# Patient Record
Sex: Female | Born: 1990
Health system: Southern US, Community
[De-identification: ages and names within clinical notes are randomized; demographics above are authoritative.]

## PROBLEM LIST (undated history)

## (undated) DIAGNOSIS — L989 Disorder of the skin and subcutaneous tissue, unspecified: Secondary | ICD-10-CM

## (undated) HISTORY — PX: NO PAST SURGERIES: SHX2092

---

## 2011-07-15 ENCOUNTER — Emergency Department (HOSPITAL_COMMUNITY)
Admission: EM | Admit: 2011-07-15 | Discharge: 2011-07-16 | Disposition: A | Discharge status: Home / Self Care | Attending: Emergency Medicine | Admitting: Emergency Medicine

## 2011-07-15 DIAGNOSIS — R221 Localized swelling, mass and lump, neck: Secondary | ICD-10-CM | POA: Insufficient documentation

## 2011-07-15 DIAGNOSIS — I10 Essential (primary) hypertension: Secondary | ICD-10-CM | POA: Insufficient documentation

## 2011-07-15 DIAGNOSIS — T7840XA Allergy, unspecified, initial encounter: Secondary | ICD-10-CM | POA: Insufficient documentation

## 2011-07-15 DIAGNOSIS — L2989 Other pruritus: Secondary | ICD-10-CM | POA: Insufficient documentation

## 2011-07-15 DIAGNOSIS — R220 Localized swelling, mass and lump, head: Secondary | ICD-10-CM | POA: Insufficient documentation

## 2011-07-15 DIAGNOSIS — R21 Rash and other nonspecific skin eruption: Secondary | ICD-10-CM | POA: Insufficient documentation

## 2011-07-15 DIAGNOSIS — L298 Other pruritus: Secondary | ICD-10-CM | POA: Insufficient documentation

## 2011-07-16 ENCOUNTER — Emergency Department (HOSPITAL_COMMUNITY)

## 2012-01-09 ENCOUNTER — Encounter (HOSPITAL_COMMUNITY): Payer: Self-pay | Admitting: Emergency Medicine

## 2012-01-09 ENCOUNTER — Emergency Department (INDEPENDENT_AMBULATORY_CARE_PROVIDER_SITE_OTHER)
Admission: EM | Admit: 2012-01-09 | Discharge: 2012-01-09 | Disposition: A | Discharge status: Home / Self Care | Source: Home / Self Care | Attending: Emergency Medicine | Admitting: Emergency Medicine

## 2012-01-09 DIAGNOSIS — L739 Follicular disorder, unspecified: Secondary | ICD-10-CM

## 2012-01-09 DIAGNOSIS — L738 Other specified follicular disorders: Secondary | ICD-10-CM

## 2012-01-09 HISTORY — DX: Disorder of the skin and subcutaneous tissue, unspecified: L98.9

## 2012-01-09 MED ORDER — MUPIROCIN 2 % EX OINT: TOPICAL_OINTMENT | Freq: Three times a day (TID) | CUTANEOUS | Status: AC

## 2012-01-09 MED ORDER — MUPIROCIN 2 % EX OINT: TOPICAL_OINTMENT | Freq: Three times a day (TID) | CUTANEOUS | Status: DC

## 2012-01-09 NOTE — Discharge Instructions (Signed)
Folliculitis  °Folliculitis is an infection and inflammation of the hair follicles. Hair follicles become red and irritated. This inflammation is usually caused by bacteria. The bacteria thrive in warm, moist environments. This condition can be seen anywhere on the body.  °CAUSES °The most common cause of folliculitis is an infection by germs (bacteria). Fungal and viral infections can also cause the condition. Viral infections may be more common in people whose bodies are unable to fight disease well (weakened immune systems). Examples include people with: °· AIDS.  °· An organ transplant.  °· Cancer.  °People with depressed immune systems, diabetes, or obesity, have a greater risk of getting folliculitis than the general population. Certain chemicals, especially oils and tars, also can cause folliculitis. °SYMPTOMS °· An early sign of folliculitis is a small, white or yellow pus-filled, itchy lesion (pustule). These lesions appear on a red, inflamed follicle. They are usually less than 5 mm (.20 inches).  °· The most likely starting points are the scalp, thighs, legs, back and buttocks. Folliculitis is also frequently found in areas of repeated shaving.  °· When an infection of the follicle goes deeper, it becomes a boil or furuncle. A group of closely packed boils create a larger lesion (a carbuncle). These sores (lesions) tend to occur in hairy, sweaty areas of the body.  °TREATMENT  °· A doctor who specializes in skin problems (dermatologists) treats mild cases of folliculitis with antiseptic washes.  °· They also use a skin application which kills germs (topical antibiotics). Tea tree oil is a good topical antiseptic as well. It can be found at a health food store. A small percentage of individuals may develop an allergy to the tea tree oil.  °· Mild to moderate boils respond well to warm water compresses applied three times daily.  °· In some cases, oral antibiotics should be taken with the skin treatment.    °· If lesions contain large quantities of pus or fluid, your caregiver may drain them. This allows the topical antibiotics to get to the affected areas better.  °· Stubborn cases of folliculitis may respond to laser hair removal. This process uses a high intensity light beam (a laser) to destroy the follicle and reduces the scarring from folliculitis. After laser hair removal, hair will no longer grow in the laser treated area.  °Patients with long-lasting folliculitis need to find out where the infection is coming from. Germs can live in the nostrils of the patient. This can trigger an outbreak now and then. Sometimes the bacteria live in the nostrils of a family member. This person does not develop the disorder but they repeatedly re-expose others to the germ. To break the cycle of recurrence in the patient, the family member must also undergo treatment. °PREVENTION  °· Individuals who are predisposed to folliculitis should be extremely careful about personal hygiene.  °· Application of antiseptic washes may help prevent recurrences.  °· A topical antibiotic cream, mupirocin (Bactroban®), has been effective at reducing bacteria in the nostrils. It is applied inside the nose with your little finger. This is done twice daily for a week. Then it is repeated every 6 months.  °· Because follicle disorders tend to come back, patients must receive follow-up care. Your caregiver may be able to recognize a recurrence before it becomes severe.  °SEEK IMMEDIATE MEDICAL CARE IF:  °· You develop redness, swelling, or increasing pain in the area.  °· You have a fever.  °· You are not improving with treatment   or are getting worse.  °· You have any other questions or concerns.  °Document Released: 11/11/2001 Document Revised: 08/22/2011 Document Reviewed: 09/07/2008 °[employer]® Patient Information ©2012 [employer]. °

## 2012-01-09 NOTE — ED Notes (Signed)
Patient reports pumps noticed in pubic area, reports as an area that she shaves .  Denies vaginal discharge.

## 2012-01-09 NOTE — ED Provider Notes (Signed)
History     CSN: [id number]  Arrival date & time 01/09/12  1237   First MD Initiated Contact with Patient 01/09/12 1246      Chief Complaint  Patient presents with  . Rash    (Consider location/radiation/quality/duration/timing/severity/associated sxs/prior treatment) HPI Comments: Patient describes that after she shaved her pubic area she have noticed as little bumps all over her pubic area and minimally to her external genitalia. Not particularly tender red looked like little cystic structures "like little bumps". Patient denies any further symptoms in his groin tenderness rashes or sores, and no vaginal discharge.  Patient is a 21 y.o. female presenting with rash. The history is provided by the patient.  Rash  This is a new problem. The current episode started more than 2 days ago. The problem has not changed since onset.   Past Medical History  Diagnosis Date  . Skin abnormalities     skin condition , unknown diagnosis    History reviewed. No pertinent past surgical history.  History reviewed. No pertinent family history.  History  Substance Use Topics  . Smoking status: Current Everyday Smoker  . Smokeless tobacco: Not on file  . Alcohol Use: Yes    OB History    Grav Para Term Preterm Abortions TAB SAB Ect Mult Living                  Review of Systems  Constitutional: Negative for fever and chills.  Genitourinary: Negative for dysuria, vaginal bleeding, vaginal discharge and vaginal pain.  Skin: Positive for rash. Negative for wound.    Allergies  Review of patient's allergies indicates no known allergies.  Home Medications   Current Outpatient Rx  Name Route Sig Dispense Refill  . DOXYCYCLINE HYCLATE 100 MG PO CPEP Oral Take 100 mg by mouth 2 (two) times daily.    Marland Kitchen MUPIROCIN 2 % EX OINT Topical Apply topically 3 (three) times daily. X 7 DAYS 22 g 0    BP 125/79  Pulse 63  Temp(Src) 98.5 F (36.9 C) (Oral)  Resp 16  SpO2 100%  LMP  12/26/2011  Physical Exam  Nursing note and vitals reviewed. Constitutional: She appears well-developed and well-nourished.  Genitourinary:    There is no rash or tenderness on the right labia. There is no rash or tenderness on the left labia.  Skin: Rash noted.    ED Course  Procedures (including critical care time)  Labs Reviewed - No data to display No results found.   1. Folliculitis       MDM  Pubic folliculitis        Jimmie Molly, MD 01/09/12 1322

## 2012-01-09 NOTE — ED Notes (Signed)
chaporoned physician for examination of rash.

## 2012-01-13 LAB — CULTURE, ROUTINE-ABSCESS: Gram Stain: NONE SEEN

## 2012-06-08 ENCOUNTER — Emergency Department (INDEPENDENT_AMBULATORY_CARE_PROVIDER_SITE_OTHER)
Admission: EM | Admit: 2012-06-08 | Discharge: 2012-06-08 | Disposition: A | Discharge status: Home / Self Care | Payer: Self-pay | Source: Home / Self Care | Attending: Emergency Medicine | Admitting: Emergency Medicine

## 2012-06-08 ENCOUNTER — Encounter (HOSPITAL_COMMUNITY): Payer: Self-pay

## 2012-06-08 DIAGNOSIS — S161XXA Strain of muscle, fascia and tendon at neck level, initial encounter: Secondary | ICD-10-CM

## 2012-06-08 DIAGNOSIS — S0003XA Contusion of scalp, initial encounter: Secondary | ICD-10-CM

## 2012-06-08 DIAGNOSIS — S1093XA Contusion of unspecified part of neck, initial encounter: Secondary | ICD-10-CM

## 2012-06-08 DIAGNOSIS — S0083XA Contusion of other part of head, initial encounter: Secondary | ICD-10-CM

## 2012-06-08 DIAGNOSIS — IMO0002 Reserved for concepts with insufficient information to code with codable children: Secondary | ICD-10-CM

## 2012-06-08 MED ORDER — MELOXICAM 15 MG PO TABS: 15.0000 mg | ORAL_TABLET | Freq: Every day | ORAL | Status: DC

## 2012-06-08 MED ORDER — METHOCARBAMOL 500 MG PO TABS: 500.0000 mg | ORAL_TABLET | Freq: Three times a day (TID) | ORAL | Status: DC

## 2012-06-08 MED ORDER — TRAMADOL HCL 50 MG PO TABS: 100.0000 mg | ORAL_TABLET | Freq: Three times a day (TID) | ORAL | Status: DC | PRN
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
## 2012-06-09 NOTE — ED Notes (Signed)
Pt called earlier wanting a note for work stating that she needs to be on "light duty".... Spoke w/Dr. Artis Flock and he said it was fine for today and tomorrow... Called pt at [phone] and notified her... Note is at the front ready for pick up.

## 2012-06-10 ENCOUNTER — Encounter (HOSPITAL_COMMUNITY): Payer: Self-pay

## 2012-06-10 ENCOUNTER — Emergency Department (INDEPENDENT_AMBULATORY_CARE_PROVIDER_SITE_OTHER)
Admission: EM | Admit: 2012-06-10 | Discharge: 2012-06-10 | Disposition: A | Discharge status: Home / Self Care | Payer: Self-pay | Source: Home / Self Care | Attending: Emergency Medicine | Admitting: Emergency Medicine

## 2012-06-10 DIAGNOSIS — M62838 Other muscle spasm: Secondary | ICD-10-CM

## 2012-06-10 DIAGNOSIS — G44209 Tension-type headache, unspecified, not intractable: Secondary | ICD-10-CM

## 2012-06-10 MED ORDER — ACETAMINOPHEN 325 MG PO TABS
650.0000 mg | ORAL_TABLET | Freq: Once | ORAL | Status: AC
  Administered 2012-06-10: 650 mg via ORAL

## 2012-06-10 MED ORDER — METHYLPREDNISOLONE 4 MG PO KIT: PACK | ORAL | Status: DC

## 2012-06-10 MED ORDER — ACETAMINOPHEN 325 MG PO TABS
ORAL_TABLET | ORAL | Status: AC
  Filled 2012-06-10: qty 2
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
## 2012-06-11 NOTE — ED Provider Notes (Signed)
Medical screening examination/treatment/procedure(s) were performed by non-physician practitioner and as supervising physician I was immediately available for consultation/collaboration.  Leslee Home, M.D.   Reuben Likes, MD 06/11/12 [phone]

## 2012-08-25 ENCOUNTER — Ambulatory Visit
Admission: RE | Admit: 2012-08-25 | Discharge: 2012-08-25 | Disposition: A | Discharge status: Home / Self Care | Payer: No Typology Code available for payment source | Source: Ambulatory Visit | Attending: Infectious Diseases | Admitting: Infectious Diseases

## 2012-08-25 ENCOUNTER — Other Ambulatory Visit: Payer: Self-pay | Admitting: Infectious Diseases

## 2012-08-25 DIAGNOSIS — R7611 Nonspecific reaction to tuberculin skin test without active tuberculosis: Secondary | ICD-10-CM

## 2012-09-22 ENCOUNTER — Emergency Department (HOSPITAL_BASED_OUTPATIENT_CLINIC_OR_DEPARTMENT_OTHER)
Admission: EM | Admit: 2012-09-22 | Discharge: 2012-09-22 | Disposition: A | Discharge status: Home / Self Care | Payer: Self-pay | Attending: Emergency Medicine | Admitting: Emergency Medicine

## 2012-09-22 ENCOUNTER — Encounter (HOSPITAL_BASED_OUTPATIENT_CLINIC_OR_DEPARTMENT_OTHER): Payer: Self-pay | Admitting: *Deleted

## 2012-09-22 DIAGNOSIS — F17200 Nicotine dependence, unspecified, uncomplicated: Secondary | ICD-10-CM | POA: Insufficient documentation

## 2012-09-22 DIAGNOSIS — L02219 Cutaneous abscess of trunk, unspecified: Secondary | ICD-10-CM | POA: Insufficient documentation

## 2012-09-22 DIAGNOSIS — L0291 Cutaneous abscess, unspecified: Secondary | ICD-10-CM

## 2012-09-22 DIAGNOSIS — L519 Erythema multiforme, unspecified: Secondary | ICD-10-CM | POA: Insufficient documentation

## 2012-09-22 DIAGNOSIS — Z872 Personal history of diseases of the skin and subcutaneous tissue: Secondary | ICD-10-CM | POA: Insufficient documentation

## 2012-09-22 DIAGNOSIS — Z79899 Other long term (current) drug therapy: Secondary | ICD-10-CM | POA: Insufficient documentation

## 2012-09-22 MED ORDER — SULFAMETHOXAZOLE-TRIMETHOPRIM 800-160 MG PO TABS: 1.0000 | ORAL_TABLET | Freq: Two times a day (BID) | ORAL | Status: DC

## 2012-09-22 MED ORDER — HYDROCODONE-ACETAMINOPHEN 5-325 MG PO TABS
2.0000 | ORAL_TABLET | ORAL | Status: DC | PRN
Start: 2012-09-22 — End: 2013-11-07

## 2012-09-22 MED ORDER — LIDOCAINE HCL 2 % IJ SOLN
INTRAMUSCULAR | Status: AC
  Administered 2012-09-22: 22:00:00 via SUBCUTANEOUS
  Filled 2012-09-22: qty 20

## 2012-09-22 NOTE — ED Provider Notes (Signed)
Medical screening examination/treatment/procedure(s) were performed by non-physician practitioner and as supervising physician I was immediately available for consultation/collaboration.  Ethelda Chick, MD 09/22/12 [phone]

## 2012-09-22 NOTE — ED Notes (Signed)
PA at bedside.

## 2012-09-22 NOTE — ED Provider Notes (Signed)
History     CSN: [id number]  Arrival date & time 09/22/12  2016   First MD Initiated Contact with Patient 09/22/12 2211      Chief Complaint  Patient presents with  . Abscess    (Consider location/radiation/quality/duration/timing/severity/associated sxs/prior treatment) Patient is a 22 y.o. female presenting with abscess. The history is provided by the patient. No language interpreter was used.  Abscess  This is a new problem. Episode onset: 3 days. The onset was gradual. The problem occurs continuously. The problem has been gradually worsening. The abscess is present on the groin. The problem is moderate. The abscess is characterized by redness. The abscess first occurred at home. There were no sick contacts. She has received no recent medical care.  Pt has a history of hidradenitis  Past Medical History  Diagnosis Date  . Skin abnormalities     skin condition , unknown diagnosis    History reviewed. No pertinent past surgical history.  No family history on file.  History  Substance Use Topics  . Smoking status: Current Every Day Smoker  . Smokeless tobacco: Not on file  . Alcohol Use: Yes    OB History    Grav Para Term Preterm Abortions TAB SAB Ect Mult Living                  Review of Systems  All other systems reviewed and are negative.    Allergies  Review of patient's allergies indicates no known allergies.  Home Medications   Current Outpatient Rx  Name  Route  Sig  Dispense  Refill  . DOXYCYCLINE HYCLATE 100 MG PO CPEP   Oral   Take 100 mg by mouth 2 (two) times daily.         Marland Kitchen HYDROCODONE-ACETAMINOPHEN 5-325 MG PO TABS   Oral   Take 2 tablets by mouth every 4 (four) hours as needed for pain.   10 tablet   0   . MELOXICAM 15 MG PO TABS   Oral   Take 1 tablet (15 mg total) by mouth daily.   15 tablet   0   . METHOCARBAMOL 500 MG PO TABS   Oral   Take 1 tablet (500 mg total) by mouth 3 (three) times daily.   30 tablet   0   .  METHYLPREDNISOLONE 4 MG PO KIT      follow package directions   21 tablet   0   . SULFAMETHOXAZOLE-TRIMETHOPRIM 800-160 MG PO TABS   Oral   Take 1 tablet by mouth every 12 (twelve) hours.   10 tablet   0   . TRAMADOL HCL 50 MG PO TABS   Oral   Take 2 tablets (100 mg total) by mouth every 8 (eight) hours as needed for pain.   30 tablet   0     BP 140/89  Pulse 74  Temp 98.6 F (37 C) (Oral)  Resp 20  SpO2 100%  Physical Exam  Nursing note and vitals reviewed. Constitutional: She appears well-developed and well-nourished.  Cardiovascular: Normal rate.   Pulmonary/Chest: Effort normal.  Musculoskeletal: Normal range of motion.       2cm swollen red area left inner thigh   Neurological: She is alert.  Skin: Skin is warm. There is erythema.    ED Course  INCISION AND DRAINAGE Date/Time: 09/22/2012 10:42 PM Performed by: Elson Areas Authorized by: Elson Areas Consent: Verbal consent not obtained. Risks and benefits: risks, benefits and alternatives  were discussed Required items: required blood products, implants, devices, and special equipment available Time out: Immediately prior to procedure a "time out" was called to verify the correct patient, procedure, equipment, support staff and site/side marked as required. Type: abscess Body area: lower extremity Anesthesia: local infiltration Patient sedated: no Scalpel size: 11 Incision type: single straight Complexity: simple Drainage amount: copious Patient tolerance: Patient tolerated the procedure well with no immediate complications.   (including critical care time)  Labs Reviewed - No data to display No results found.   1. Abscess       MDM  Bactrim and hydrocodone        Lonia Skinner [place], [place] 09/22/12 2243

## 2012-09-22 NOTE — ED Notes (Signed)
I & D tray is set up and at the bedside. The patient is undressed from the waist down.

## 2012-09-22 NOTE — ED Notes (Signed)
Abscess on her buttocks x 3 days.  

## 2013-11-07 ENCOUNTER — Inpatient Hospital Stay (HOSPITAL_COMMUNITY)
Admission: AD | Admit: 2013-11-07 | Discharge: 2013-11-07 | Disposition: A | Discharge status: Home / Self Care | Payer: No Typology Code available for payment source | Source: Ambulatory Visit | Attending: Obstetrics & Gynecology | Admitting: Obstetrics & Gynecology

## 2013-11-07 ENCOUNTER — Encounter (HOSPITAL_COMMUNITY): Payer: Self-pay | Admitting: *Deleted

## 2013-11-07 DIAGNOSIS — L02213 Cutaneous abscess of chest wall: Secondary | ICD-10-CM

## 2013-11-07 DIAGNOSIS — Z87891 Personal history of nicotine dependence: Secondary | ICD-10-CM | POA: Insufficient documentation

## 2013-11-07 DIAGNOSIS — L03319 Cellulitis of trunk, unspecified: Principal | ICD-10-CM

## 2013-11-07 DIAGNOSIS — L02219 Cutaneous abscess of trunk, unspecified: Secondary | ICD-10-CM | POA: Insufficient documentation

## 2013-11-07 LAB — CBC
HCT: 37 % (ref 36.0–46.0)
Hemoglobin: 12.8 g/dL (ref 12.0–15.0)
MCH: 27.3 pg (ref 26.0–34.0)
MCHC: 34.6 g/dL (ref 30.0–36.0)
MCV: 78.9 fL (ref 78.0–100.0)
PLATELETS: 221 10*3/uL (ref 150–400)
RBC: 4.69 MIL/uL (ref 3.87–5.11)
RDW: 14.5 % (ref 11.5–15.5)
WBC: 5.3 10*3/uL (ref 4.0–10.5)

## 2013-11-07 LAB — POCT PREGNANCY, URINE: PREG TEST UR: NEGATIVE

## 2013-11-07 MED ORDER — IBUPROFEN 600 MG PO TABS: 600.0000 mg | ORAL_TABLET | Freq: Four times a day (QID) | ORAL | Status: DC | PRN

## 2013-11-07 MED ORDER — DOXYCYCLINE HYCLATE 100 MG PO CAPS: 100.0000 mg | ORAL_CAPSULE | Freq: Two times a day (BID) | ORAL | Status: DC

## 2013-11-07 NOTE — MAU Provider Note (Signed)
History     CSN: [id number]  Arrival date and time: 11/07/13 [id number]   First Provider Initiated Contact with Patient 11/07/13 1032      Chief Complaint  Patient presents with  . Breast Mass   HPI   Wendy Cook is a 23 y.o. female G1P0010 who presents with a right painful breast mass. She first noticed it two weeks ago; she watched it for two weeks and feels it has grown in size and the pain severity has increased. The patient normally sleeps on her stomach and since the mass has been there she is unable to get a good nights rest due to the pain.   OB History   Grav Para Term Preterm Abortions TAB SAB Ect Mult Living   1 0   1  1   0      Past Medical History  Diagnosis Date  . Skin abnormalities     skin condition , unknown diagnosis    Past Surgical History  Procedure Laterality Date  . No past surgeries      Family History  Problem Relation Age of Onset  . Cancer Mother   . Diabetes Mother   . Hypertension Mother   . Heart disease Father   . Cancer Maternal Aunt   . Cancer Maternal Grandmother     History  Substance Use Topics  . Smoking status: Former Games developermoker  . Smokeless tobacco: Never Used  . Alcohol Use: Yes    Allergies: No Known Allergies  Prescriptions prior to admission  Medication Sig Dispense Refill  . acetaminophen (TYLENOL) 500 MG tablet Take 1,000 mg by mouth every 6 (six) hours as needed.      . Multiple Vitamin (MULTIVITAMIN WITH MINERALS) TABS tablet Take 1 tablet by mouth daily.       Results for orders placed during the hospital encounter of 11/07/13 (from the past 48 hour(s))  CBC     Status: None   Collection Time    11/07/13 10:55 AM      Result Value Ref Range   WBC 5.3  4.0 - 10.5 K/uL   RBC 4.69  3.87 - 5.11 MIL/uL   Hemoglobin 12.8  12.0 - 15.0 g/dL   HCT 69.637.0  29.536.0 - 28.446.0 %   MCV 78.9  78.0 - 100.0 fL   MCH 27.3  26.0 - 34.0 pg   MCHC 34.6  30.0 - 36.0 g/dL   RDW 13.214.5  44.011.5 - 10.215.5 %   Platelets 221  150 - 400  K/uL  POCT PREGNANCY, URINE     Status: None   Collection Time    11/07/13 11:32 AM      Result Value Ref Range   Preg Test, Ur NEGATIVE  NEGATIVE   Comment:            THE SENSITIVITY OF THIS     METHODOLOGY IS >24 mIU/mL    Review of Systems  Constitutional: Negative for fever and chills.  Genitourinary: Negative for dysuria, urgency, frequency, hematuria and flank pain.  Skin: Negative for itching and rash.       Right painful breast mass    Physical Exam   Blood pressure 136/77, pulse 80, temperature 97.8 F (36.6 C), temperature source Oral, resp. rate 20, height 5\' 3"  (1.6 m), weight 106.595 kg (235 lb), last menstrual period 11/06/2013.  Physical Exam  Constitutional: She is oriented to person, place, and time. She appears well-developed and well-nourished.  Non-toxic appearance. She does  not have a sickly appearance. She does not appear ill. No distress.  HENT:  Head: Normocephalic.  Eyes: Pupils are equal, round, and reactive to light.  Neck: Neck supple.  Respiratory: She exhibits mass, tenderness and swelling. Right breast exhibits no skin change. Left breast exhibits no skin change.    3cm X 1 cm deep tissue abscess on chest wall; warm and tender to touch. Erythema noted  Musculoskeletal: Normal range of motion.  Neurological: She is alert and oriented to person, place, and time.  Skin: Skin is warm. She is not diaphoretic.  Psychiatric: Her behavior is normal.    MAU Course  Procedures None  MDM CBC Urine pregnancy test Dr. Penne Lash consulted for evaluation and management of chest wall abscess.   Assessment and Plan   A:  1. Abscess of chest wall   2.  Cellulitis   P:  Discharge home in stable condition RX: Doxycycline        Ibuprofen 600 mg Warm compresses QID  General surgery consulted; they would like to see the patient as soon as possible and will call the patient tomorrow with an appointment time. Pt was instructed to call them if she has  not heard from them tomorrow; phone number provided   Wendy Cook Wendy Moeckel, NP  11/07/2013, 3:42 PM

## 2013-11-07 NOTE — MAU Provider Note (Signed)
Pt seen and examined. Attestation of Attending Supervision of Advanced Practitioner (CNM/NP): Evaluation and management procedures were performed by the Advanced Practitioner under my supervision and collaboration. I have reviewed the Advanced Practitioner's note and chart, and I agree with the management and plan.  Evely Gainey H. 4:30 PM   Shomari Scicchitano H.

## 2013-11-07 NOTE — Progress Notes (Signed)
Pt seen and examined.   Exam consistent with deep subcutaneous cellulitis.  No fluctuant area on exam.  Spoke with general surgery Dr. Gerrit FriendsGerkin.  He suggest oupatient US and follow up in their office tomorrow.  They will call with appt.  We will give doxycycline RX, ibuprofen, and warm compresses.

## 2013-11-07 NOTE — Discharge Instructions (Signed)
Abscess °An abscess is an infected area that contains a collection of pus and debris. It can occur in almost any part of the body. An abscess is also known as a furuncle or boil. °CAUSES  °An abscess occurs when tissue gets infected. This can occur from blockage of oil or sweat glands, infection of hair follicles, or a minor injury to the skin. As the body tries to fight the infection, pus collects in the area and creates pressure under the skin. This pressure causes pain. People with weakened immune systems have difficulty fighting infections and get certain abscesses more often.  °SYMPTOMS °Usually an abscess develops on the skin and becomes a painful mass that is red, warm, and tender. If the abscess forms under the skin, you may feel a moveable soft area under the skin. Some abscesses break open (rupture) on their own, but most will continue to get worse without care. The infection can spread deeper into the body and eventually into the bloodstream, causing you to feel ill.  °DIAGNOSIS  °Your caregiver will take your medical history and perform a physical exam. A sample of fluid may also be taken from the abscess to determine what is causing your infection. °TREATMENT  °Your caregiver may prescribe antibiotic medicines to fight the infection. However, taking antibiotics alone usually does not cure an abscess. Your caregiver may need to make a small cut (incision) in the abscess to drain the pus. In some cases, gauze is packed into the abscess to reduce pain and to continue draining the area. °HOME CARE INSTRUCTIONS  °· Only take over-the-counter or prescription medicines for pain, discomfort, or fever as directed by your caregiver. °· If you were prescribed antibiotics, take them as directed. Finish them even if you start to feel better. °· If gauze is used, follow your caregiver's directions for changing the gauze. °· To avoid spreading the infection: °· Keep your draining abscess covered with a  bandage. °· Wash your hands well. °· Do not share personal care items, towels, or whirlpools with others. °· Avoid skin contact with others. °· Keep your skin and clothes clean around the abscess. °· Keep all follow-up appointments as directed by your caregiver. °SEEK MEDICAL CARE IF:  °· You have increased pain, swelling, redness, fluid drainage, or bleeding. °· You have muscle aches, chills, or a general ill feeling. °· You have a fever. °MAKE SURE YOU:  °· Understand these instructions. °· Will watch your condition. °· Will get help right away if you are not doing well or get worse. °Document Released: 06/12/2005 Document Revised: 03/03/2012 Document Reviewed: 11/15/2011 °ExitCare® Patient Information ©2014 [employer]. ° °Cellulitis °Cellulitis is an infection of the skin and the tissue beneath it. The infected area is usually red and tender. Cellulitis occurs most often in the arms and lower legs.  °CAUSES  °Cellulitis is caused by bacteria that enter the skin through cracks or cuts in the skin. The most common types of bacteria that cause cellulitis are Staphylococcus and Streptococcus. °SYMPTOMS  °· Redness and warmth. °· Swelling. °· Tenderness or pain. °· Fever. °DIAGNOSIS  °Your caregiver can usually determine what is wrong based on a physical exam. Blood tests may also be done. °TREATMENT  °Treatment usually involves taking an antibiotic medicine. °HOME CARE INSTRUCTIONS  °· Take your antibiotics as directed. Finish them even if you start to feel better. °· Keep the infected arm or leg elevated to reduce swelling. °· Apply a warm cloth to the affected area up to   4 times per day to relieve pain. °· Only take over-the-counter or prescription medicines for pain, discomfort, or fever as directed by your caregiver. °· Keep all follow-up appointments as directed by your caregiver. °SEEK MEDICAL CARE IF:  °· You notice red streaks coming from the infected area. °· Your red area gets larger or turns dark in  color. °· Your bone or joint underneath the infected area becomes painful after the skin has healed. °· Your infection returns in the same area or another area. °· You notice a swollen bump in the infected area. °· You develop new symptoms. °SEEK IMMEDIATE MEDICAL CARE IF:  °· You have a fever. °· You feel very sleepy. °· You develop vomiting or diarrhea. °· You have a general ill feeling (malaise) with muscle aches and pains. °MAKE SURE YOU:  °· Understand these instructions. °· Will watch your condition. °· Will get help right away if you are not doing well or get worse. °Document Released: 06/12/2005 Document Revised: 03/03/2012 Document Reviewed: 11/18/2011 °ExitCare® Patient Information ©2014 [employer]. ° °

## 2013-11-07 NOTE — MAU Note (Signed)
Patient presents with complaint of a painful lump in her right breast. Patient states she noticed the lump 2 weeks ago. Over the last 3 days the lump has enlarged and become painful.

## 2013-11-08 ENCOUNTER — Encounter (HOSPITAL_BASED_OUTPATIENT_CLINIC_OR_DEPARTMENT_OTHER): Payer: Self-pay | Admitting: Emergency Medicine

## 2013-11-08 ENCOUNTER — Telehealth (INDEPENDENT_AMBULATORY_CARE_PROVIDER_SITE_OTHER): Payer: Self-pay

## 2013-11-08 ENCOUNTER — Emergency Department (HOSPITAL_BASED_OUTPATIENT_CLINIC_OR_DEPARTMENT_OTHER)
Admission: EM | Admit: 2013-11-08 | Discharge: 2013-11-08 | Disposition: A | Discharge status: Home / Self Care | Payer: No Typology Code available for payment source | Attending: Emergency Medicine | Admitting: Emergency Medicine

## 2013-11-08 ENCOUNTER — Encounter (INDEPENDENT_AMBULATORY_CARE_PROVIDER_SITE_OTHER): Payer: Self-pay | Admitting: Surgery

## 2013-11-08 DIAGNOSIS — Z87891 Personal history of nicotine dependence: Secondary | ICD-10-CM | POA: Insufficient documentation

## 2013-11-08 DIAGNOSIS — Z792 Long term (current) use of antibiotics: Secondary | ICD-10-CM | POA: Insufficient documentation

## 2013-11-08 DIAGNOSIS — L03319 Cellulitis of trunk, unspecified: Principal | ICD-10-CM

## 2013-11-08 DIAGNOSIS — L02219 Cutaneous abscess of trunk, unspecified: Secondary | ICD-10-CM | POA: Insufficient documentation

## 2013-11-08 DIAGNOSIS — Z872 Personal history of diseases of the skin and subcutaneous tissue: Secondary | ICD-10-CM | POA: Insufficient documentation

## 2013-11-08 DIAGNOSIS — L0291 Cutaneous abscess, unspecified: Secondary | ICD-10-CM

## 2013-11-08 MED ORDER — HYDROCODONE-ACETAMINOPHEN 5-325 MG PO TABS: 1.0000 | ORAL_TABLET | ORAL | Status: DC | PRN

## 2013-11-08 NOTE — ED Notes (Signed)
3 days of abcess on right breast no drainage only swelling and pain

## 2013-11-08 NOTE — ED Notes (Signed)
[facility] and given prescription for doxycycline states she is not taking it decided she would wait until after she was seen here today

## 2013-11-08 NOTE — ED Provider Notes (Signed)
TIME SEEN: 12:16 PM  CHIEF COMPLAINT: chest wall abscess  HPI: Pt is a a 23 y.o. F with a history of prior abscesses who presents to the emergency department with an abscess in between her breast has been present for the past 3 days. She was seen at [facility] clinic yesterday and was instructed to followup with Gen. Surgery for an outpatient ultrasound. She states she has called to make an appointment but has not heard back. She states that she is here today because she wants the abscess drained because she can no longer miss any work. She denies any fevers, chills, nausea, vomiting, diarrhea. No nipple drainage or breast pain.  ROS: See HPI Constitutional: no fever  Eyes: no drainage  ENT: no runny nose   Cardiovascular:  no chest pain  Resp: no SOB  GI: no vomiting GU: no dysuria Integumentary: no rash  Allergy: no hives  Musculoskeletal: no leg swelling  Neurological: no slurred speech ROS otherwise negative  PAST MEDICAL HISTORY/PAST SURGICAL HISTORY:  Past Medical History  Diagnosis Date  . Skin abnormalities     skin condition , unknown diagnosis    MEDICATIONS:  Prior to Admission medications   Medication Sig Start Date End Date Taking? Authorizing Provider  acetaminophen (TYLENOL) 500 MG tablet Take 1,000 mg by mouth every 6 (six) hours as needed.    Historical Provider, MD  doxycycline (VIBRAMYCIN) 100 MG capsule Take 1 capsule (100 mg total) by mouth 2 (two) times daily. 11/07/13   Iona Hansen Rasch, NP  ibuprofen (ADVIL,MOTRIN) 600 MG tablet Take 1 tablet (600 mg total) by mouth every 6 (six) hours as needed. 11/07/13   Debbrah Alar, NP  Multiple Vitamin (MULTIVITAMIN WITH MINERALS) TABS tablet Take 1 tablet by mouth daily.    Historical Provider, MD    ALLERGIES:  No Known Allergies  SOCIAL HISTORY:  History  Substance Use Topics  . Smoking status: Former Games developer  . Smokeless tobacco: Never Used  . Alcohol Use: Yes    FAMILY HISTORY: Family History   Problem Relation Age of Onset  . Cancer Mother   . Diabetes Mother   . Hypertension Mother   . Heart disease Father   . Cancer Maternal Aunt   . Cancer Maternal Grandmother     EXAM: BP 146/87  Pulse 68  Temp(Src) 98.6 F (37 C) (Oral)  Resp 18  Ht 5\' 3"  (1.6 m)  Wt 235 lb (106.595 kg)  BMI 41.64 kg/m2  SpO2 100%  LMP 11/06/2013 CONSTITUTIONAL: Alert and oriented and responds appropriately to questions. Well-appearing; well-nourished, nontoxic, no apparent distress HEAD: Normocephalic EYES: Conjunctivae clear, PERRL ENT: normal nose; no rhinorrhea; moist mucous membranes; pharynx without lesions noted NECK: Supple, no meningismus, no LAD  CARD: RRR; S1 and S2 appreciated; no murmurs, no clicks, no rubs, no gallops RESP: Normal chest excursion without splinting or tachypnea; breath sounds clear and equal bilaterally; no wheezes, no rhonchi, no rales,  ABD/GI: Normal bowel sounds; non-distended; soft, non-tender, no rebound, no guarding BACK:  The back appears normal and is non-tender to palpation, there is no CVA tenderness EXT: Normal ROM in all joints; non-tender to palpation; no edema; normal capillary refill; no cyanosis    SKIN: Normal color for age and race; warm; patient has a 3 x 4 cm fluctuant area in between her breasts that does not appear to involve her breast tissue with no surrounding cellulitis NEURO: Moves all extremities equally PSYCH: The patient's mood and manner are appropriate. Grooming  and personal hygiene are appropriate.  MEDICAL DECISION MAKING: Pt has a 3 x 4 cm fluctuant area in between her breasts that is a fluid collection confirmed by ultrasound. Given this area is very superficial and does not appear to involve the actual breast tissue, will I&D in the ED.  The patient has prescription for doxycycline she has not yet filled.  ED PROGRESS: Have I&D'ed abscess and packed wound.  Pt to fill her abx.  Called general surgery office and has scheduled  appointment in 8 days.  Given supportive care instructions.  Patient verbalizes understanding and is comfortable with plan.   INCISION AND DRAINAGE Performed by: Raelyn NumberWARD, KRISTEN N Consent: Verbal consent obtained. Risks and benefits: risks, benefits and alternatives were discussed Type: abscess  Body area: Chest wall  Anesthesia: local infiltration  Incision was made with a scalpel.  Local anesthetic: lidocaine 2% without epinephrine  Anesthetic total: 7 ml  Complexity: complex Blunt dissection to break up loculations  Drainage: purulent  Drainage amount: 25 mL  Packing material: 1/4 in iodoform gauze  Patient tolerance: Patient tolerated the procedure well with no immediate complications.        Layla MawKristen N Ward, DO 11/08/13 1319

## 2013-11-08 NOTE — Telephone Encounter (Signed)
[facility] [place] asking to [facility] today but schedule for follow up next week she does not want to pay two co-pays today. appt 11-16-13@1130  with DR. Carolynne Edouardoth

## 2013-11-08 NOTE — Discharge Instructions (Signed)
Abscess An abscess is an infected area that contains a collection of pus and debris.It can occur in almost any part of the body. An abscess is also known as a furuncle or boil. CAUSES  An abscess occurs when tissue gets infected. This can occur from blockage of oil or sweat glands, infection of hair follicles, or a minor injury to the skin. As the body tries to fight the infection, pus collects in the area and creates pressure under the skin. This pressure causes pain. People with weakened immune systems have difficulty fighting infections and get certain abscesses more often.  SYMPTOMS Usually an abscess develops on the skin and becomes a painful mass that is red, warm, and tender. If the abscess forms under the skin, you may feel a moveable soft area under the skin. Some abscesses break open (rupture) on their own, but most will continue to get worse without care. The infection can spread deeper into the body and eventually into the bloodstream, causing you to feel ill.  DIAGNOSIS  Your caregiver will take your medical history and perform a physical exam. A sample of fluid may also be taken from the abscess to determine what is causing your infection. TREATMENT  Your caregiver may prescribe antibiotic medicines to fight the infection. However, taking antibiotics alone usually does not cure an abscess. Your caregiver may need to make a small cut (incision) in the abscess to drain the pus. In some cases, gauze is packed into the abscess to reduce pain and to continue draining the area. HOME CARE INSTRUCTIONS   Only take over-the-counter or prescription medicines for pain, discomfort, or fever as directed by your caregiver.  If you were prescribed antibiotics, take them as directed. Finish them even if you start to feel better.  If gauze is used, follow your caregiver's directions for changing the gauze.  To avoid spreading the infection:  Keep your draining abscess covered with a  bandage.  Wash your hands well.  Do not share personal care items, towels, or whirlpools with others.  Avoid skin contact with others.  Keep your skin and clothes clean around the abscess.  Keep all follow-up appointments as directed by your caregiver. SEEK MEDICAL CARE IF:   You have increased pain, swelling, redness, fluid drainage, or bleeding.  You have muscle aches, chills, or a general ill feeling.  You have a fever. MAKE SURE YOU:   Understand these instructions.  Will watch your condition.  Will get help right away if you are not doing well or get worse. Document Released: 06/12/2005 Document Revised: 03/03/2012 Document Reviewed: 11/15/2011 [facility] Patient Information 2014 [employer], [place].  Please take your antibiotics as prescribed. Please followup with Gen. Surgery as scheduled.

## 2013-11-16 ENCOUNTER — Ambulatory Visit (INDEPENDENT_AMBULATORY_CARE_PROVIDER_SITE_OTHER): Payer: No Typology Code available for payment source | Admitting: General Surgery

## 2013-11-16 ENCOUNTER — Telehealth (INDEPENDENT_AMBULATORY_CARE_PROVIDER_SITE_OTHER): Payer: Self-pay

## 2013-11-16 ENCOUNTER — Encounter (INDEPENDENT_AMBULATORY_CARE_PROVIDER_SITE_OTHER): Payer: Self-pay | Admitting: General Surgery

## 2013-11-16 VITALS — BP 130/82 | HR 72 | Temp 99.0°F | Resp 14 | Ht 63.0 in | Wt 249.6 lb

## 2013-11-16 DIAGNOSIS — L02219 Cutaneous abscess of trunk, unspecified: Secondary | ICD-10-CM

## 2013-11-16 DIAGNOSIS — L03319 Cellulitis of trunk, unspecified: Secondary | ICD-10-CM

## 2013-11-16 DIAGNOSIS — L02213 Cutaneous abscess of chest wall: Secondary | ICD-10-CM

## 2013-11-16 NOTE — Telephone Encounter (Signed)
Called pt with appt 

## 2013-11-16 NOTE — Progress Notes (Signed)
Patient ID: Wendy Cook, female   DOB: 1990-11-06, 23 y.o.   MRN: [id number]  Chief Complaint  Patient presents with  . Routine Post Op    1st p/o rt br infection    HPI Wendy Cook is a 23 y.o. female.  We are asked to see the patient in consultation by Dr. Belenda Cruise Ward to evaluate her for a chest wall abscess. The patient is a 23 year old black female who recently developed a painful swollen area between her breasts but off to the right. She went to the emergency department last Monday where it was opened and drained and packed with gauze. It took approximately 4 days for the gauze to fall out. She has been taking doxycycline. She denies any fevers or chills. She has had minimal drainage from the area over the last couple days. She has been showering with antibacterial soap.  HPI  Past Medical History  Diagnosis Date  . Skin abnormalities     skin condition , unknown diagnosis    Past Surgical History  Procedure Laterality Date  . No past surgeries      Family History  Problem Relation Age of Onset  . Cancer Mother     breast  . Diabetes Mother   . Hypertension Mother   . Heart disease Father   . Cancer Maternal Aunt   . Cancer Maternal Grandmother     breast    Social History History  Substance Use Topics  . Smoking status: Former Smoker    Quit date: 09/16/2012  . Smokeless tobacco: Never Used  . Alcohol Use: Yes    No Known Allergies  Current Outpatient Prescriptions  Medication Sig Dispense Refill  . doxycycline (VIBRAMYCIN) 100 MG capsule Take 1 capsule (100 mg total) by mouth 2 (two) times daily.  42 capsule  0  . Multiple Vitamin (MULTIVITAMIN WITH MINERALS) TABS tablet Take 1 tablet by mouth daily.       No current facility-administered medications for this visit.    Review of Systems Review of Systems  Constitutional: Negative.   HENT: Negative.   Eyes: Negative.   Respiratory: Negative.   Cardiovascular: Negative.   Gastrointestinal:  Negative.   Endocrine: Negative.   Genitourinary: Negative.   Musculoskeletal: Negative.   Skin: Negative.   Allergic/Immunologic: Negative.   Neurological: Negative.   Hematological: Negative.   Psychiatric/Behavioral: Negative.     Blood pressure 130/82, pulse 72, temperature 99 F (37.2 C), temperature source Oral, resp. rate 14, height 5\' 3"  (1.6 m), weight 249 lb 9.6 oz (113.218 kg), last menstrual period 11/06/2013.  Physical Exam Physical Exam  Constitutional: She is oriented to person, place, and time. She appears well-developed and well-nourished.  HENT:  Head: Normocephalic and atraumatic.  Eyes: Conjunctivae and EOM are normal. Pupils are equal, round, and reactive to light.  Neck: Normal range of motion. Neck supple.  Cardiovascular: Normal rate, regular rhythm and normal heart sounds.   Pulmonary/Chest: Effort normal and breath sounds normal.  There is a small open area between her breasts just to the right of midline. There is some surrounding induration but no redness of the skin. The cavity was probed with a Q-tip and does not track anywhere. There is minimal drainage from the area.  Abdominal: Soft. Bowel sounds are normal.  Musculoskeletal: Normal range of motion.  Lymphadenopathy:    She has no cervical adenopathy.  Neurological: She is alert and oriented to person, place, and time.  Skin: Skin is warm and dry.  Psychiatric: She has a normal mood and affect. Her behavior is normal.    Data Reviewed As above  Assessment    The patient appears to have a chest wall abscess that was incised and drained. It seems to be healing reasonably well. I have encouraged her to continue showering with an antibacterial soap. She will continue to keep the area covered and clean. She will finish her prescription for doxycycline.     Plan    Plan will be to see her back in 2 weeks to check the progress of the wound        TOTH III,Jayron Maqueda S 11/16/2013, 12:03 PM

## 2013-11-16 NOTE — Telephone Encounter (Signed)
Message copied by Brennan BaileyBROOKS, Cory Kitt on Tue Nov 16, 2013  2:03 PM ------      Message from: Leanne ChangGALLOWAY, WENDY K      Created: Tue Nov 16, 2013 12:06 PM      Regarding: Dr Alvester Morinoth      Contact: [phone]       Need 2 wk f/u appt from today's visit. Prefers after 4p but will take what you have. ------

## 2013-11-16 NOTE — Patient Instructions (Signed)
Shower daily with antibacterial soap Finish doxycycline

## 2013-11-29 ENCOUNTER — Encounter (INDEPENDENT_AMBULATORY_CARE_PROVIDER_SITE_OTHER): Payer: No Typology Code available for payment source | Admitting: General Surgery

## 2014-02-12 IMAGING — CR DG CHEST 1V
1 series · 1 of 1 positions shown · non-contrast
Comparison: None.

CLINICAL DATA: Positive PPD

CHEST - 1 VIEW

[view not recorded]
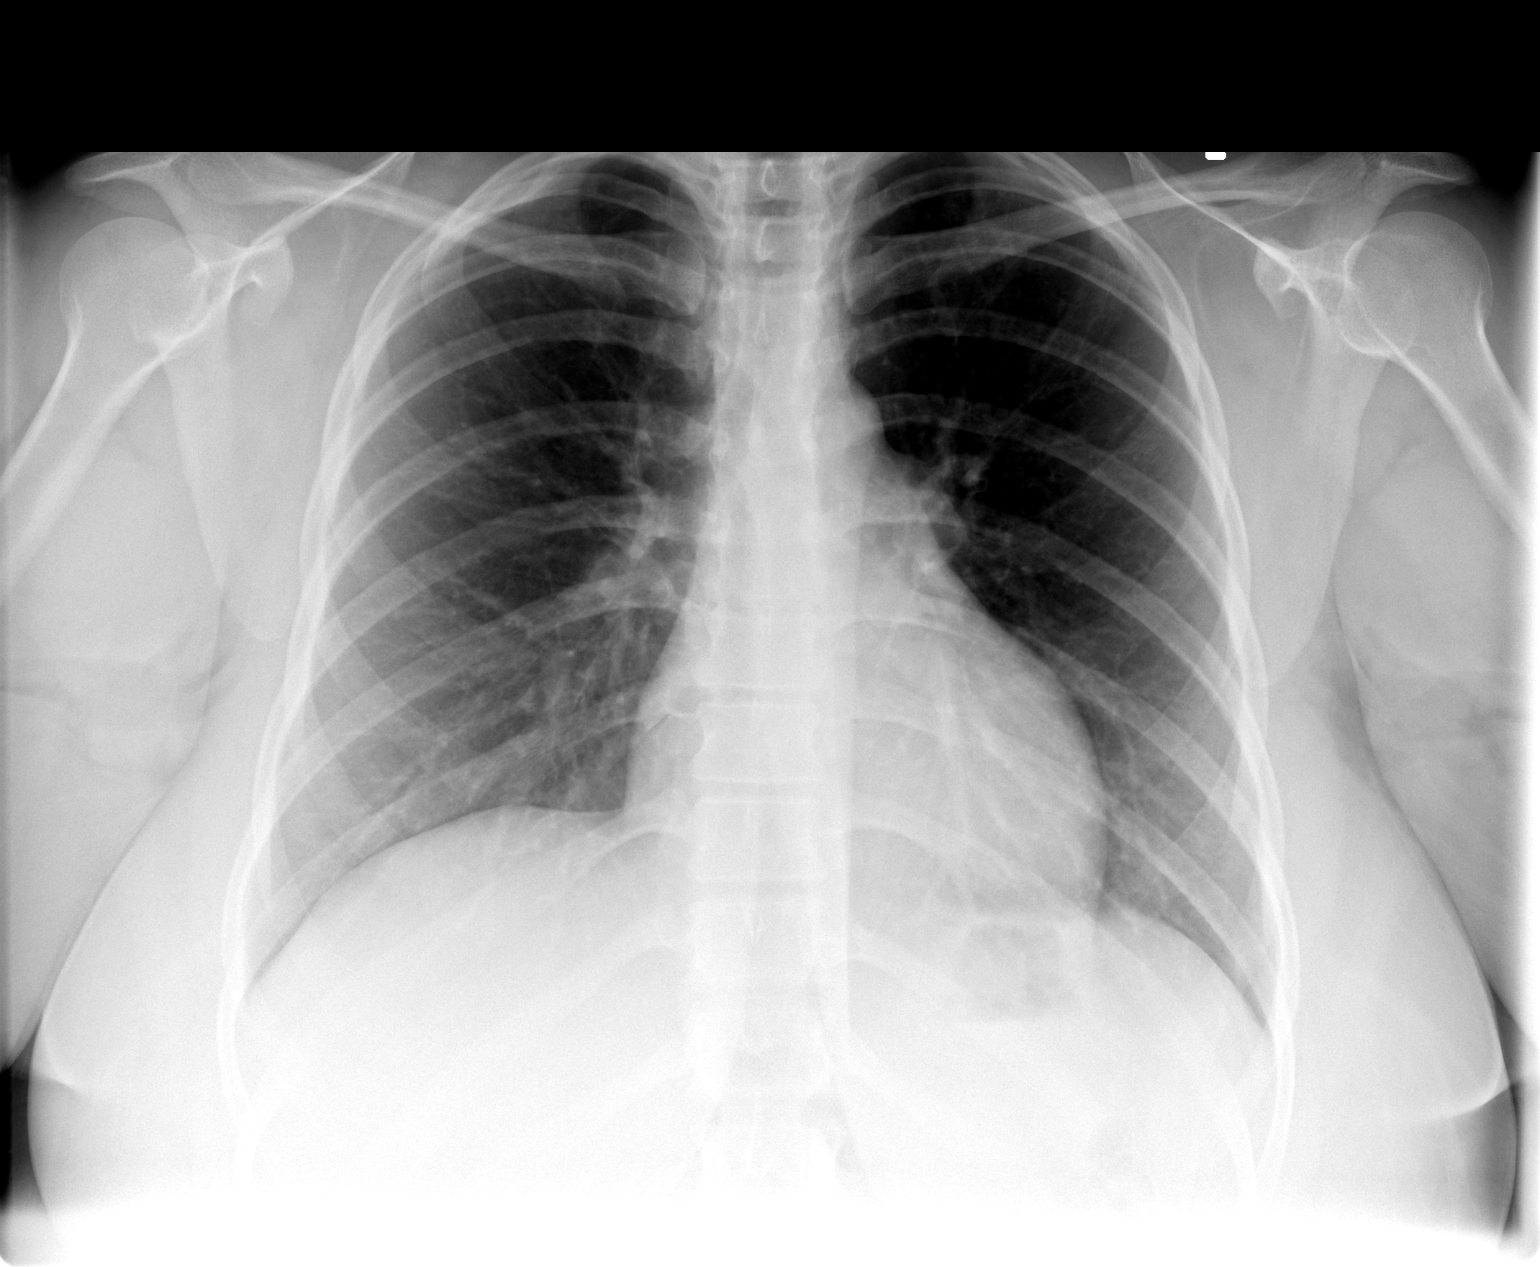

[1 of 1 positions shown; findings below may reference images not displayed]

FINDINGS: No active infiltrate or effusion is seen.  No sequela of
prior tuberculous infection is seen.  Mediastinal contours appear
normal.  The heart is within normal limits in size.  No bony
abnormality is noted.
IMPRESSION: No active lung disease.

## 2014-07-18 ENCOUNTER — Encounter (INDEPENDENT_AMBULATORY_CARE_PROVIDER_SITE_OTHER): Payer: Self-pay | Admitting: General Surgery

## 2015-02-15 ENCOUNTER — Encounter (HOSPITAL_BASED_OUTPATIENT_CLINIC_OR_DEPARTMENT_OTHER): Payer: Self-pay | Admitting: Emergency Medicine

## 2015-02-15 ENCOUNTER — Emergency Department (HOSPITAL_BASED_OUTPATIENT_CLINIC_OR_DEPARTMENT_OTHER)
Admission: EM | Admit: 2015-02-15 | Discharge: 2015-02-15 | Disposition: A | Discharge status: Home / Self Care | Payer: No Typology Code available for payment source | Attending: Emergency Medicine | Admitting: Emergency Medicine

## 2015-02-15 DIAGNOSIS — Z87891 Personal history of nicotine dependence: Secondary | ICD-10-CM | POA: Insufficient documentation

## 2015-02-15 DIAGNOSIS — Z79899 Other long term (current) drug therapy: Secondary | ICD-10-CM | POA: Insufficient documentation

## 2015-02-15 DIAGNOSIS — L0291 Cutaneous abscess, unspecified: Secondary | ICD-10-CM

## 2015-02-15 DIAGNOSIS — L02411 Cutaneous abscess of right axilla: Secondary | ICD-10-CM | POA: Insufficient documentation

## 2015-02-15 MED ORDER — DOXYCYCLINE HYCLATE 100 MG PO CAPS: 100.0000 mg | ORAL_CAPSULE | Freq: Two times a day (BID) | ORAL | Status: AC

## 2015-02-15 MED ORDER — LIDOCAINE-EPINEPHRINE 1 %-1:100000 IJ SOLN
INTRAMUSCULAR | Status: AC
  Filled 2015-02-15: qty 1

## 2015-02-15 NOTE — Discharge Instructions (Signed)

## 2015-02-15 NOTE — ED Provider Notes (Signed)
CSN: [id number]     Arrival date & time 02/15/15  1423 History   First MD Initiated Contact with Patient 02/15/15 1510     Chief Complaint  Patient presents with  . Abscess    under right arm     (Consider location/radiation/quality/duration/timing/severity/associated sxs/prior Treatment) Patient is a 24 y.o. female presenting with abscess. The history is provided by the patient. No language interpreter was used.  Abscess Location:  Shoulder/arm Shoulder/arm abscess location:  R axilla Abscess quality: induration, painful and redness   Red streaking: yes   Duration:  7 days Progression:  Worsening Pain details:    Quality:  Pressure   Timing:  Constant Chronicity:  Recurrent Context: not diabetes     Past Medical History  Diagnosis Date  . Skin abnormalities     skin condition , unknown diagnosis   Past Surgical History  Procedure Laterality Date  . No past surgeries     Family History  Problem Relation Age of Onset  . Cancer Mother     breast  . Diabetes Mother   . Hypertension Mother   . Heart disease Father   . Cancer Maternal Aunt   . Cancer Maternal Grandmother     breast   History  Substance Use Topics  . Smoking status: Former Smoker    Quit date: 09/16/2012  . Smokeless tobacco: Never Used  . Alcohol Use: Yes   OB History    Gravida Para Term Preterm AB TAB SAB Ectopic Multiple Living   1 0   1  1   0     Review of Systems  All other systems reviewed and are negative.     Allergies  Review of patient's allergies indicates no known allergies.  Home Medications   Prior to Admission medications   Medication Sig Start Date End Date Taking? Authorizing Provider  doxycycline (VIBRAMYCIN) 100 MG capsule Take 1 capsule (100 mg total) by mouth 2 (two) times daily. 02/15/15   Teressa Lower, NP  Multiple Vitamin (MULTIVITAMIN WITH MINERALS) TABS tablet Take 1 tablet by mouth daily.    Historical Provider, MD   BP 162/89 mmHg  Pulse 72   Temp(Src) 98.2 F (36.8 C) (Oral)  Resp 18  Ht  (1.6 m)  Wt 250 lb (113.399 kg)  BMI 44.30 kg/m2  SpO2 100%  LMP 02/05/2015 Physical Exam  Constitutional: She is oriented to person, place, and time. She appears well-developed and well-nourished.  Cardiovascular: Normal rate and regular rhythm.   Pulmonary/Chest: Effort normal and breath sounds normal.  Musculoskeletal: Normal range of motion.  Neurological: She is alert and oriented to person, place, and time.  Skin:  Fluctuant area to the right axilla with redness to the area.   Nursing note and vitals reviewed.   ED Course  INCISION AND DRAINAGE Date/Time: 02/15/2015 3:50 PM Performed by: Teressa Lower Authorized by: Teressa Lower Consent: Verbal consent obtained. Consent given by: patient Patient identity confirmed: verbally with patient Type: abscess Body area: upper extremity (right axilla) Anesthesia: local infiltration and see MAR for details Scalpel size: 11 Complexity: simple Drainage: purulent Drainage amount: scant Wound treatment: wound left open Patient tolerance: Patient tolerated the procedure well with no immediate complications   (including critical care time) Labs Review Labs Reviewed - No data to display  Imaging Review No results found.   EKG Interpretation None      MDM   Final diagnoses:  Abscess    Pt given doxy for infection. Discussed  follow up and return precautions with pt.    Saksham Akkerman PTeressa Lowerickering, NP 02/15/15 1552  Nelva Nayobert Beaton, MD 02/20/15 2159

## 2015-02-15 NOTE — ED Notes (Signed)
Np at bedside

## 2015-02-15 NOTE — ED Notes (Signed)
Abscess under right arm for 1 week

## 2015-12-19 ENCOUNTER — Encounter (HOSPITAL_BASED_OUTPATIENT_CLINIC_OR_DEPARTMENT_OTHER): Payer: Self-pay | Admitting: Emergency Medicine

## 2015-12-19 ENCOUNTER — Emergency Department (HOSPITAL_BASED_OUTPATIENT_CLINIC_OR_DEPARTMENT_OTHER)
Admission: EM | Admit: 2015-12-19 | Discharge: 2015-12-20 | Disposition: A | Discharge status: Home / Self Care | Payer: Self-pay | Attending: Emergency Medicine | Admitting: Emergency Medicine

## 2015-12-19 DIAGNOSIS — L0291 Cutaneous abscess, unspecified: Secondary | ICD-10-CM

## 2015-12-19 DIAGNOSIS — E663 Overweight: Secondary | ICD-10-CM | POA: Insufficient documentation

## 2015-12-19 DIAGNOSIS — Z79899 Other long term (current) drug therapy: Secondary | ICD-10-CM | POA: Insufficient documentation

## 2015-12-19 DIAGNOSIS — Z87891 Personal history of nicotine dependence: Secondary | ICD-10-CM | POA: Insufficient documentation

## 2015-12-19 DIAGNOSIS — Z792 Long term (current) use of antibiotics: Secondary | ICD-10-CM | POA: Insufficient documentation

## 2015-12-19 DIAGNOSIS — N611 Abscess of the breast and nipple: Secondary | ICD-10-CM | POA: Insufficient documentation

## 2015-12-19 NOTE — ED Notes (Signed)
Patient states that she had a boil to her breast.

## 2015-12-19 NOTE — ED Provider Notes (Signed)
CSN: [id number]     Arrival date & time 12/19/15  2213 History  By signing my name below, I, Marisue Humble, attest that this documentation has been prepared under the direction and in the presence of Shon Baton, MD . Electronically Signed: Marisue Humble, Scribe. 12/19/2015. 12:03 AM.   Chief Complaint  Patient presents with  . Recurrent Skin Infections   The history is provided by the patient. No language interpreter was used.   HPI Comments:  Wendy Cook is a 25 y.o. female with PMHx of skin abnormalities who presents to the Emergency Department complaining of 7/10 painful abscess to right breast for the past four days. She treated with warm compress with no relief. Pt notes h/o abscess under arms and reports this is the second abscess to her right breast. Pt denies any redness, drainage, fever, nipple discharge, major medical problems or daily medications.  Past Medical History  Diagnosis Date  . Skin abnormalities     skin condition , unknown diagnosis   Past Surgical History  Procedure Laterality Date  . No past surgeries     Family History  Problem Relation Age of Onset  . Cancer Mother     breast  . Diabetes Mother   . Hypertension Mother   . Heart disease Father   . Cancer Maternal Aunt   . Cancer Maternal Grandmother     breast   Social History  Substance Use Topics  . Smoking status: Former Smoker    Quit date: 09/16/2012  . Smokeless tobacco: Never Used  . Alcohol Use: Yes   OB History    Gravida Para Term Preterm AB TAB SAB Ectopic Multiple Living   1 0   1  1   0     Review of Systems  Constitutional: Negative for fever.  Skin: Positive for wound (right breast). Negative for color change.  All other systems reviewed and are negative.  Allergies  Review of patient's allergies indicates no known allergies.  Home Medications   Prior to Admission medications   Medication Sig Start Date End Date Taking? Authorizing Provider  cephALEXin  (KEFLEX) 500 MG capsule Take 1 capsule (500 mg total) by mouth 4 (four) times daily. 12/20/15   Shon Baton, MD  doxycycline (VIBRAMYCIN) 100 MG capsule Take 1 capsule (100 mg total) by mouth 2 (two) times daily. 02/15/15   Teressa Lower, NP  Multiple Vitamin (MULTIVITAMIN WITH MINERALS) TABS tablet Take 1 tablet by mouth daily.    Historical Provider, MD  oxyCODONE-acetaminophen (PERCOCET/ROXICET) 5-325 MG tablet Take 1 tablet by mouth every 6 (six) hours as needed for severe pain. 12/20/15   Shon Baton, MD   BP 149/91 mmHg  Pulse 74  Temp(Src) 98.7 F (37.1 C) (Oral)  Resp 16  Ht  (1.6 m)  Wt 250 lb (113.399 kg)  BMI 44.30 kg/m2  SpO2 100%  LMP 12/06/2015 Physical Exam  Constitutional: She is oriented to person, place, and time. She appears well-developed and well-nourished.  overweight  HENT:  Head: Normocephalic and atraumatic.  Cardiovascular: Normal rate, regular rhythm and normal heart sounds.   No murmur heard. Pulmonary/Chest: Effort normal and breath sounds normal. No respiratory distress. She has no wheezes. Right breast exhibits tenderness. Right breast exhibits no mass.    Neurological: She is alert and oriented to person, place, and time.  Skin: Skin is warm and dry.  Psychiatric: She has a normal mood and affect.  Nursing note and vitals reviewed.  ED Course  .Marland Kitchen.Incision and Drainage Date/Time: 12/20/2015 1:32 AM Performed by: Shon BatonHORTON, COURTNEY F Authorized by: Shon BatonHORTON, COURTNEY F Consent: Verbal consent obtained. Risks and benefits: risks, benefits and alternatives were discussed Consent given by: patient Type: abscess Body area: trunk Location details: right breast Anesthesia: local infiltration Local anesthetic: lidocaine 1% with epinephrine Anesthetic total: 5 ml Risk factor: underlying major vessel Scalpel size: 11 Incision type: single straight Complexity: simple Drainage: purulent Drainage amount: moderate Packing material: 1/4 in  iodoform gauze Patient tolerance: Patient tolerated the procedure well with no immediate complications    DIAGNOSTIC STUDIES:  Oxygen Saturation is 100% on RA, normal by my interpretation.    COORDINATION OF CARE:  12:01 AM Performed US of abscess. Discussed treatment plan with pt at bedside and pt agreed to plan.  Labs Review Labs Reviewed - No data to display  Imaging Review No results found. I have personally reviewed and evaluated these images and lab results as part of my medical decision-making.   EKG Interpretation None      MDM   Final diagnoses:  Abscess    Patient with history of hidradenitis who presents with an abscess of the right breast. Overlying skin changes. Abscess I indeed at the bedside without difficulty and without immediate complication. Packing placed. Wound check in 2 days. Patient will be placed on Keflex given history of hidradenitis and recurrent skin infections.  After history, exam, and medical workup I feel the patient has been appropriately medically screened and is safe for discharge home. Pertinent diagnoses were discussed with the patient. Patient was given return precautions.  I personally performed the services described in this documentation, which was scribed in my presence. The recorded information has been reviewed and is accurate.    Shon Batonourtney F Horton, MD 12/20/15 [phone]

## 2015-12-20 MED ORDER — CEPHALEXIN 500 MG PO CAPS: 500.0000 mg | ORAL_CAPSULE | Freq: Four times a day (QID) | ORAL | Status: AC

## 2015-12-20 MED ORDER — OXYCODONE-ACETAMINOPHEN 5-325 MG PO TABS: 1.0000 | ORAL_TABLET | Freq: Four times a day (QID) | ORAL | Status: AC | PRN

## 2015-12-20 MED ORDER — LIDOCAINE-EPINEPHRINE 2 %-1:100000 IJ SOLN
20.0000 mL | Freq: Once | INTRAMUSCULAR | Status: AC
  Administered 2015-12-20: 20 mL
  Filled 2015-12-20: qty 1

## 2015-12-20 MED FILL — CEPHALEXIN 500 MG CAPSULE: 500 | 5 days supply | Qty: 20 | Fill #0

## 2015-12-20 MED FILL — OXYCODONE/APAP 5-325: 5-325 | 2 days supply | Qty: 6 | Fill #0

## 2015-12-20 NOTE — Discharge Instructions (Signed)
